# Patient Record
Sex: Male | Born: 1965 | Race: Black or African American | Hispanic: No | Marital: Single
Health system: Southern US, Community
[De-identification: ages and names within clinical notes are randomized; demographics above are authoritative.]

## PROBLEM LIST (undated history)

## (undated) ENCOUNTER — Ambulatory Visit

## (undated) ENCOUNTER — Telehealth

## (undated) ENCOUNTER — Encounter

## (undated) ENCOUNTER — Ambulatory Visit: Payer: MEDICARE

## (undated) ENCOUNTER — Ambulatory Visit: Attending: Physician Assistant | Primary: Physician Assistant

## (undated) ENCOUNTER — Encounter: Attending: Medical | Primary: Medical

## (undated) ENCOUNTER — Ambulatory Visit: Payer: MEDICARE | Attending: Gastroenterology | Primary: Gastroenterology

## (undated) DIAGNOSIS — I10 Essential (primary) hypertension: Secondary | ICD-10-CM

---

## 2004-01-30 ENCOUNTER — Emergency Department (HOSPITAL_COMMUNITY): Admission: EM | Admit: 2004-01-30 | Discharge: 2004-01-31 | Payer: Self-pay | Admitting: *Deleted

## 2004-05-30 ENCOUNTER — Emergency Department (HOSPITAL_COMMUNITY): Admission: EM | Admit: 2004-05-30 | Discharge: 2004-05-31 | Payer: Self-pay | Admitting: Emergency Medicine

## 2007-04-17 ENCOUNTER — Emergency Department: Payer: Self-pay | Admitting: Emergency Medicine

## 2007-11-23 ENCOUNTER — Emergency Department (HOSPITAL_COMMUNITY): Admission: EM | Admit: 2007-11-23 | Discharge: 2007-11-23 | Payer: Self-pay | Admitting: Emergency Medicine

## 2010-11-12 LAB — COMPREHENSIVE METABOLIC PANEL
ALT: 27
Creatinine, Ser: 1.12
GFR calc non Af Amer: >60
Total Protein: 7.2

## 2010-11-12 LAB — CBC
MCV: 99.2
Platelets: 168
RBC: 4.94
RDW: 13.7
WBC: 9.7

## 2010-11-12 LAB — URINALYSIS, ROUTINE W REFLEX MICROSCOPIC
Bilirubin Urine: NEGATIVE
Hgb urine dipstick: NEGATIVE
Ketones, ur: NEGATIVE
Nitrite: NEGATIVE
Protein, ur: NEGATIVE
Urobilinogen, UA: 1

## 2010-11-12 LAB — DIFFERENTIAL
Basophils Absolute: 0.1
Eosinophils Absolute: 0.1
Eosinophils Relative: 1
Lymphocytes Relative: 39
Lymphs Abs: 3.8
Neutrophils Relative %: 55

## 2010-11-12 LAB — POCT CARDIAC MARKERS
CKMB, poc: 1.4
Myoglobin, poc: 114
Troponin i, poc: <0.05

## 2017-04-21 ENCOUNTER — Ambulatory Visit: Admit: 2017-04-21 | Discharge: 2017-04-22 | Payer: MEDICARE

## 2017-04-21 DIAGNOSIS — R42 Dizziness and giddiness: Principal | ICD-10-CM

## 2017-04-21 DIAGNOSIS — R2689 Other abnormalities of gait and mobility: Secondary | ICD-10-CM

## 2017-04-21 DIAGNOSIS — H9313 Tinnitus, bilateral: Secondary | ICD-10-CM

## 2017-08-24 ENCOUNTER — Encounter (HOSPITAL_COMMUNITY): Payer: Self-pay

## 2017-08-24 ENCOUNTER — Emergency Department (HOSPITAL_COMMUNITY)
Admission: EM | Admit: 2017-08-24 | Discharge: 2017-08-24 | Disposition: A | Discharge status: Home / Self Care | Payer: Medicare Other | Attending: Emergency Medicine | Admitting: Emergency Medicine

## 2017-08-24 ENCOUNTER — Emergency Department (HOSPITAL_COMMUNITY): Payer: Medicare Other

## 2017-08-24 ENCOUNTER — Other Ambulatory Visit: Payer: Self-pay

## 2017-08-24 DIAGNOSIS — I10 Essential (primary) hypertension: Secondary | ICD-10-CM | POA: Diagnosis not present

## 2017-08-24 DIAGNOSIS — R001 Bradycardia, unspecified: Secondary | ICD-10-CM | POA: Insufficient documentation

## 2017-08-24 DIAGNOSIS — N433 Hydrocele, unspecified: Secondary | ICD-10-CM | POA: Diagnosis not present

## 2017-08-24 DIAGNOSIS — N50811 Right testicular pain: Secondary | ICD-10-CM | POA: Diagnosis present

## 2017-08-24 HISTORY — DX: Essential (primary) hypertension: I10

## 2017-08-24 LAB — URINALYSIS, ROUTINE W REFLEX MICROSCOPIC
BILIRUBIN URINE: NEGATIVE
Glucose, UA: NEGATIVE mg/dL
Hgb urine dipstick: NEGATIVE
Ketones, ur: NEGATIVE mg/dL
LEUKOCYTES UA: NEGATIVE
NITRITE: NEGATIVE
Protein, ur: NEGATIVE mg/dL
SPECIFIC GRAVITY, URINE: 1.001 — AB (ref 1.005–1.030)
pH: 8 (ref 5.0–8.0)

## 2017-08-24 LAB — COMPREHENSIVE METABOLIC PANEL
ALK PHOS: 69 U/L (ref 38–126)
ALT: 21 U/L (ref 0–44)
ANION GAP: 6 (ref 5–15)
AST: 28 U/L (ref 15–41)
Albumin: 3.4 g/dL — ABNORMAL LOW (ref 3.5–5.0)
BUN: 5 mg/dL — ABNORMAL LOW (ref 6–20)
CALCIUM: 8.9 mg/dL (ref 8.9–10.3)
CHLORIDE: 107 mmol/L (ref 98–111)
CO2: 27 mmol/L (ref 22–32)
Creatinine, Ser: 1.14 mg/dL (ref 0.61–1.24)
GFR calc non Af Amer: >60 mL/min (ref >60)
Glucose, Bld: 88 mg/dL (ref 70–99)
POTASSIUM: 3.5 mmol/L (ref 3.5–5.1)
SODIUM: 140 mmol/L (ref 135–145)
Total Bilirubin: 0.9 mg/dL (ref 0.3–1.2)
Total Protein: 6.5 g/dL (ref 6.5–8.1)

## 2017-08-24 LAB — CBC WITH DIFFERENTIAL/PLATELET
Abs Immature Granulocytes: 0 10*3/uL (ref 0.0–0.1)
Basophils Absolute: 0.1 10*3/uL (ref 0.0–0.1)
Basophils Relative: 1 %
EOS ABS: 0.1 10*3/uL (ref 0.0–0.7)
Eosinophils Relative: 1 %
HEMATOCRIT: 44.7 % (ref 39.0–52.0)
Hemoglobin: 14.6 g/dL (ref 13.0–17.0)
IMMATURE GRANULOCYTES: 0 %
LYMPHS ABS: 3.1 10*3/uL (ref 0.7–4.0)
Lymphocytes Relative: 36 %
MCH: 32.2 pg (ref 26.0–34.0)
MCHC: 32.7 g/dL (ref 30.0–36.0)
MCV: 98.5 fL (ref 78.0–100.0)
Monocytes Absolute: 0.6 10*3/uL (ref 0.1–1.0)
Monocytes Relative: 7 %
NEUTROS ABS: 4.8 10*3/uL (ref 1.7–7.7)
NEUTROS PCT: 55 %
PLATELETS: 154 10*3/uL (ref 150–400)
RBC: 4.54 MIL/uL (ref 4.22–5.81)
RDW: 12.9 % (ref 11.5–15.5)
WBC: 8.7 10*3/uL (ref 4.0–10.5)

## 2017-08-24 NOTE — Discharge Instructions (Addendum)
Thank you for allowing me to care for you today in the Emergency Department.   Please call tomorrow morning to schedule follow-up appointment with urology.  Your blood pressure was high this day despite taking her home blood pressure medication.  Please call and schedule a follow-up appointment with your primary care provider to have your blood pressure rechecked.  You should return to the emergency department if your blood pressure is high and you were having chest pain, severe headache, if you become unable to pee, or severe abdominal pain.  Take 600 mg of ibuprofen with food or 650 mg of Tylenol every 6 hours for pain.  Return to the emergency department if you develop a high fever, chills, if you become unable to pee, if the area around the penis or the scrotum becomes red, hot, or swollen to the touch, or if you develop other new, concerning symptoms.

## 2017-08-24 NOTE — ED Triage Notes (Signed)
Patient complains of groin pain x 2 days, denies testicle swelling, denies dysuria. Reports that he thinks his hernia is acting uo, NAD

## 2017-08-24 NOTE — ED Notes (Signed)
ED Provider at bedside. 

## 2017-08-24 NOTE — ED Provider Notes (Signed)
[facility] EMERGENCY DEPARTMENT Provider Note   CSN: [id number] Arrival date & time: 08/24/17  1057     History   Chief Complaint No chief complaint on file.   HPI Jeffrey SersDwight Seman is a 52 y.o. male with a history of hypertension who presents to the emergency department with a chief complaint of right testicle and groin pain.  The patient endorses recurrent episodes of pain to the right groin and right testicle, which have been going on for some time, but have gradually worsened over time..  Current episode of pain began 2 days ago.  He is unable to characterize the pain.  Pain is worse with walking and with sexual intercourse.  He states that he had intercourse earlier this week for the first time and to stop due to the pain.  He denies dysuria, penile or testicular swelling, penile discharge, fever, chills, rectal pain, abdominal pain, nausea, vomiting, or diarrhea.  He reports a history of a hernia repair on the right several years ago.  No treatment prior to arrival.  He reports that he has followed by his PCP for his hypertension.  He was compliant with his home antihypertensives this morning.  He denies chest pain, headache, dyspnea, visual changes, dizziness, lightheadedness at this time.  He does report some discomfort in his right shoulder that radiates his numbness and tingling intermittently down to his right hand.  He states the pain is intermittent and is only present if he sleeps on the right shoulder and with certain positional changes.  He is declining any pain or numbness at this time.  He reports that he is a former [employer] and remains active.  The history is provided by the patient. No language interpreter was used.    Past Medical History:  Diagnosis Date  . Hypertension     There are no active problems to display for this patient.   History reviewed. No pertinent surgical history.      Home Medications    Prior to Admission  medications   Not on File    Family History No family history on file.  Social History Social History   Tobacco Use  . Smoking status: Not on file  Substance Use Topics  . Alcohol use: Not on file  . Drug use: Not on file     Allergies   Patient has no known allergies.   Review of Systems Review of Systems  Constitutional: Negative for appetite change, chills and fever.  Eyes: Negative for visual disturbance.  Respiratory: Negative for shortness of breath.   Cardiovascular: Negative for chest pain.  Gastrointestinal: Negative for abdominal pain, constipation, diarrhea, nausea and vomiting.  Genitourinary: Positive for testicular pain. Negative for decreased urine volume, difficulty urinating, discharge, dysuria, flank pain, frequency, penile pain, penile swelling and scrotal swelling.  Musculoskeletal: Negative for back pain.  Skin: Negative for rash.  Allergic/Immunologic: Negative for immunocompromised state.  Neurological: Negative for dizziness, syncope, weakness, light-headedness and headaches.  Psychiatric/Behavioral: Negative for confusion.   Physical Exam Updated Vital Signs BP (!) 207/100   Pulse (!) 39   Temp 98.2 F (36.8 C)   Resp 20   SpO2 100%   Physical Exam  Constitutional: He appears well-developed.  HENT:  Head: Normocephalic.  Eyes: Conjunctivae are normal.  Neck: Neck supple.  Cardiovascular: Normal rate, regular rhythm, normal heart sounds and intact distal pulses. Exam reveals no gallop and no friction rub.  No murmur heard. Pulmonary/Chest: Effort normal. No stridor. No  respiratory distress. He has no wheezes. He has no rales. He exhibits no tenderness.  Abdominal: Soft. Bowel sounds are normal. He exhibits no distension and no mass. There is no tenderness. There is no rebound and no guarding. No hernia. Hernia confirmed negative in the right inguinal area and confirmed negative in the left inguinal area.  Abdomen is soft, nontender,  nondistended.  Genitourinary: Penis normal. Right testis shows tenderness. Right testis shows no mass and no swelling. Right testis is descended. Left testis shows no mass, no swelling and no tenderness. Left testis is descended. No phimosis, paraphimosis, hypospadias, penile erythema or penile tenderness.  Genitourinary Comments: Chaperoned exam. TTP  to the right inguinal area.  Negative for inguinal hernias bilaterally.  Musculoskeletal: He exhibits no tenderness.  No tenderness to the thoracic or lumbar spinous processes or paraspinal muscles bilaterally.  Lymphadenopathy: No inguinal adenopathy noted on the right or left side.  Neurological: He is alert.  Skin: Skin is warm and dry.  Psychiatric: His behavior is normal.  Nursing note and vitals reviewed.  ED Treatments / Results  Labs (all labs ordered are listed, but only abnormal results are displayed) Labs Reviewed  URINALYSIS, ROUTINE W REFLEX MICROSCOPIC - Abnormal; Notable for the following components:      Result Value   Color, Urine STRAW (*)    Specific Gravity, Urine 1.001 (*)    All other components within normal limits  COMPREHENSIVE METABOLIC PANEL - Abnormal; Notable for the following components:   BUN 5 (*)    Albumin 3.4 (*)    All other components within normal limits  CBC WITH DIFFERENTIAL/PLATELET    EKG None  Radiology US Scrotum W/doppler  Result Date: 08/24/2017 CLINICAL DATA:  Right groin and testicular pain for the past 2 days. EXAM: SCROTAL ULTRASOUND DOPPLER ULTRASOUND OF THE TESTICLES TECHNIQUE: Complete ultrasound examination of the testicles, epididymis, and other scrotal structures was performed. Color and spectral Doppler ultrasound were also utilized to evaluate blood flow to the testicles. COMPARISON:  None. FINDINGS: Right testicle Measurements: 3.8 x 1.9 x 3.0 cm. No mass or microlithiasis visualized. Left testicle Measurements: 3.4 x 2.8 x 2.4 cm. No mass or microlithiasis visualized. Right  epididymis:  Normal in size and appearance. Left epididymis:  Normal in size and appearance. Hydrocele:  Small bilateral hydroceles. Varicocele:  None visualized. Pulsed Doppler interrogation of both testes demonstrates normal low resistance arterial and venous waveforms bilaterally. IMPRESSION: 1. No acute abnormality. 2. Small bilateral hydroceles. Electronically Signed   By: Obie Dredge M.D.   On: 08/24/2017 14:35    Procedures Procedures (including critical care time)  Medications Ordered in ED Medications - No data to display   Initial Impression / Assessment and Plan / ED Course  I have reviewed the triage vital signs and the nursing notes.  Pertinent labs & imaging results that were available during my care of the patient were reviewed by me and considered in my medical decision making (see chart for details).     52 year old male with a history of hypertension presenting with right testicular and groin pain.  He reports recurrent episodes of pain for some time, but the most recent episode has been present for 2 days.  On exam, the patient is tender to palpation to the right testicle and right inguinal region.  No inguinal hernias bilaterally.  No testicular erythema or edema.  Labs are reassuring.  Scrotal ultrasound with bilateral hydroceles.  Otherwise negative.  EKG ordered by nursing staff with sinus  bradycardia.  Review of the patient's medical chart reveals that he has previously been seen by cardiology for sinus bradycardia in the past.  He is asymptomatic at this time.  Blood pressure has been fluctuating systolically between 160 and 200s as the patient's arrival.  He has taken his home blood pressure medications.  He is asymptomatic in regards to his hypertension and I doubt hypertensive urgency or emergency at this time.  Doubt Fournier's, varicocele, testicular neoplasm, or testicular torsion.  The patient was discussed with Dr. Juleen China, attending physician.  Recommended NSAIDs  for pain control and will give the patient a referral to urology.  I have also recommended that he follow-up with his PCP this week for a recheck of his blood pressure.  Strict return precautions to the emergency department given, including signs of hypertensive urgency or emergency, the patient acknowledges this and all questions have been answered.  He is in no acute distress and is safe for discharge home at this time.  Final Clinical Impressions(s) / ED Diagnoses   Final diagnoses:  Bilateral hydrocele  Bradycardia    ED Discharge Orders    None       Jeffrey Boards, PA-C 08/24/17 2146    Raeford Razor, MD 08/25/17 1526

## 2017-08-24 NOTE — ED Notes (Signed)
Patient Alert and oriented to baseline. Stable and ambulatory to baseline. Patient verbalized understanding of the discharge instructions.  Patient belongings were taken by the patient.   

## 2017-08-24 NOTE — ED Notes (Signed)
Unable to find pt in waiting room. Called multiple times with no answer.

## 2019-12-14 IMAGING — US US SCROTUM W/ DOPPLER COMPLETE
1 series · 14 of 25 positions shown · non-contrast
Comparison: None.

CLINICAL DATA: Right groin and testicular pain for the past 2 days.

EXAM:
SCROTAL ULTRASOUND
DOPPLER ULTRASOUND OF THE TESTICLES
TECHNIQUE: Complete ultrasound examination of the testicles, epididymis, and
other scrotal structures was performed. Color and spectral Doppler
ultrasound were also utilized to evaluate blood flow to the
testicles.

[Series 1: us scrotum w/ doppler complete · 0.07mm/px · 14 of 75 slices shown]
[im 1/75]
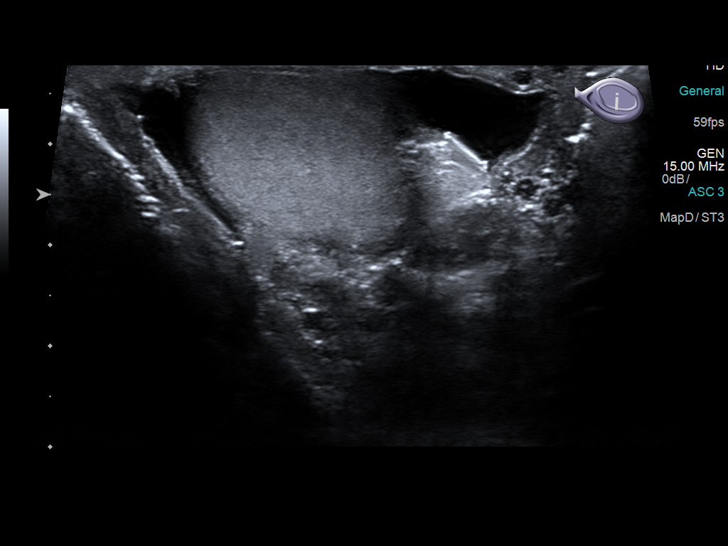
[im 7/75]
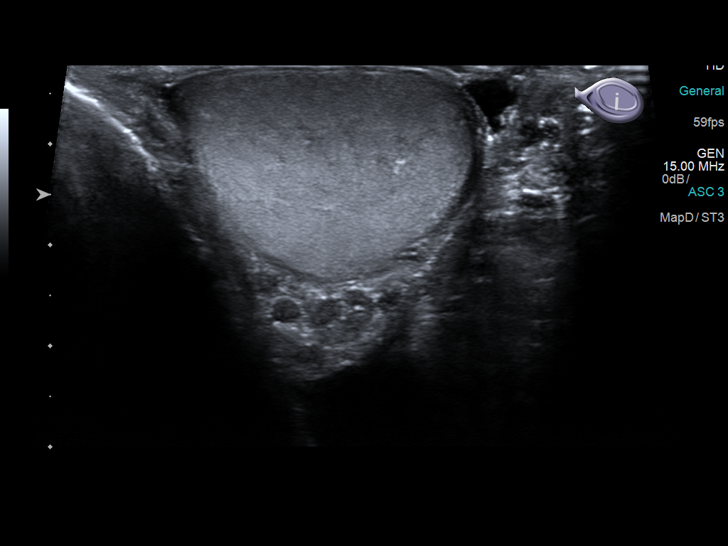
[im 13/75]
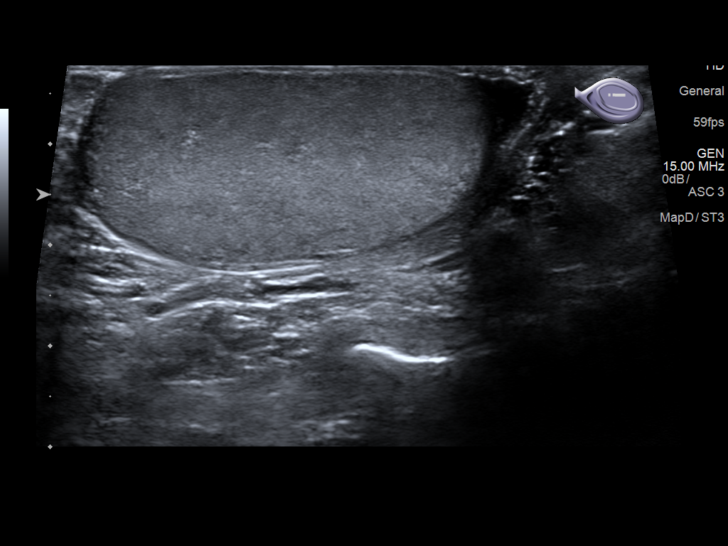
[im 19/75]
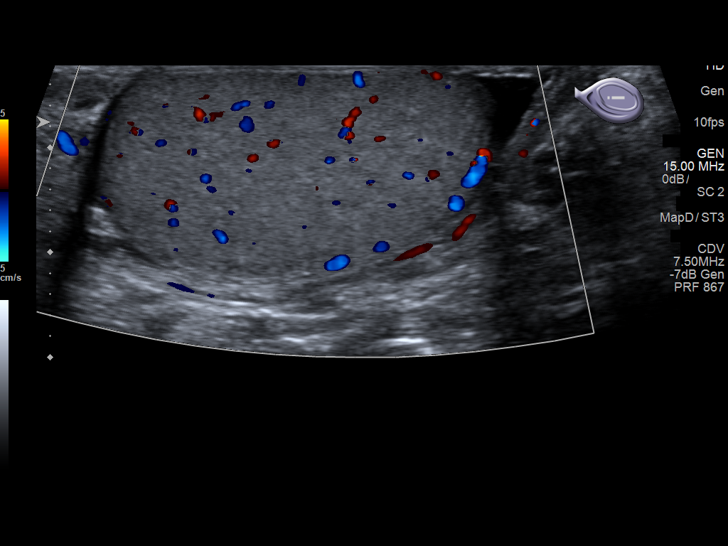
[im 25/75]
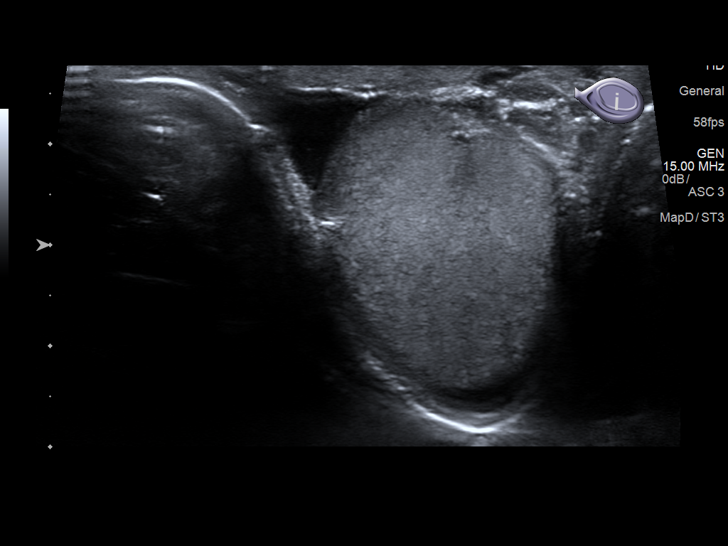
[im 28/75]
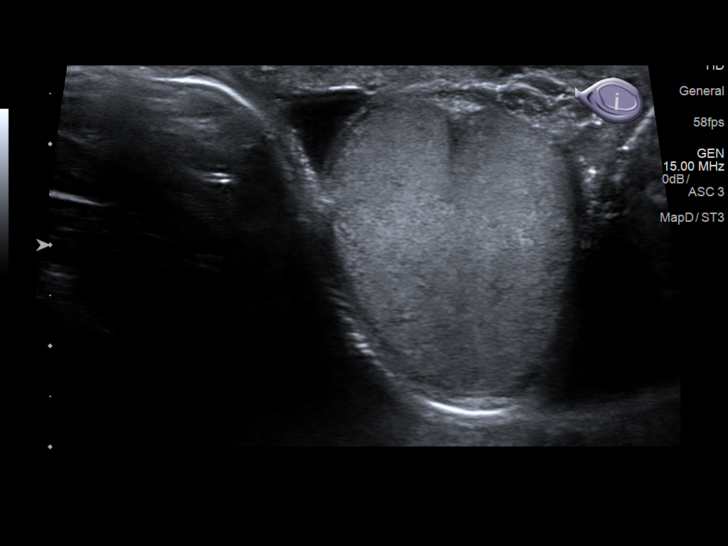
[im 34/75]
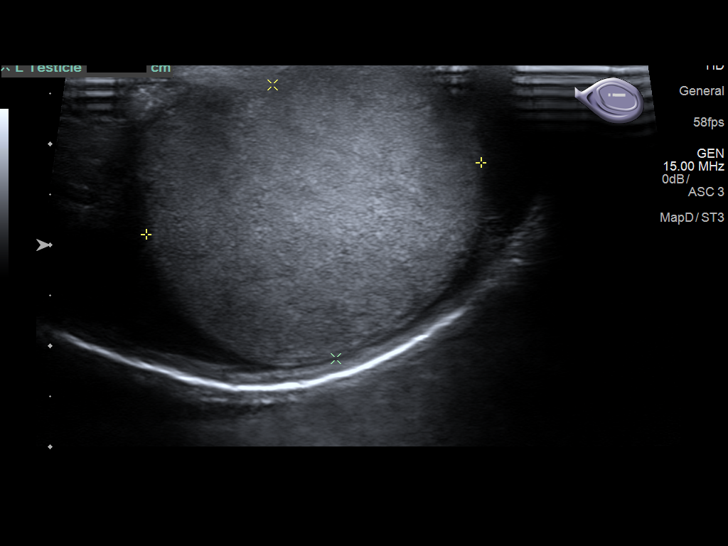
[im 41/75]
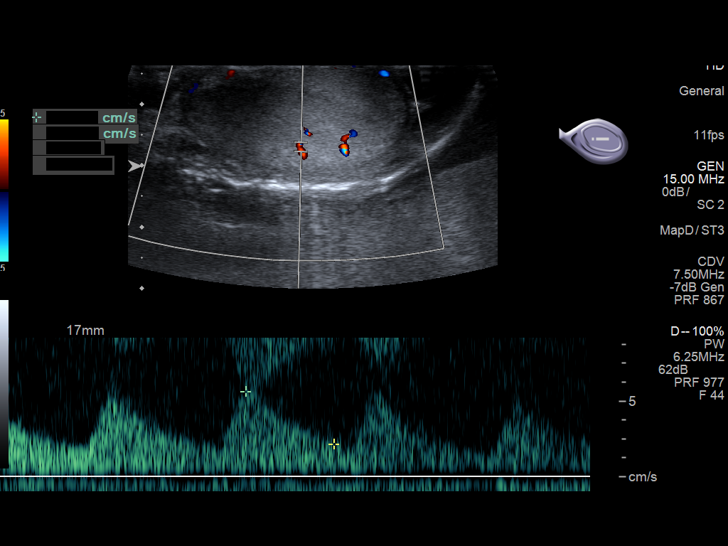
[im 47/75]
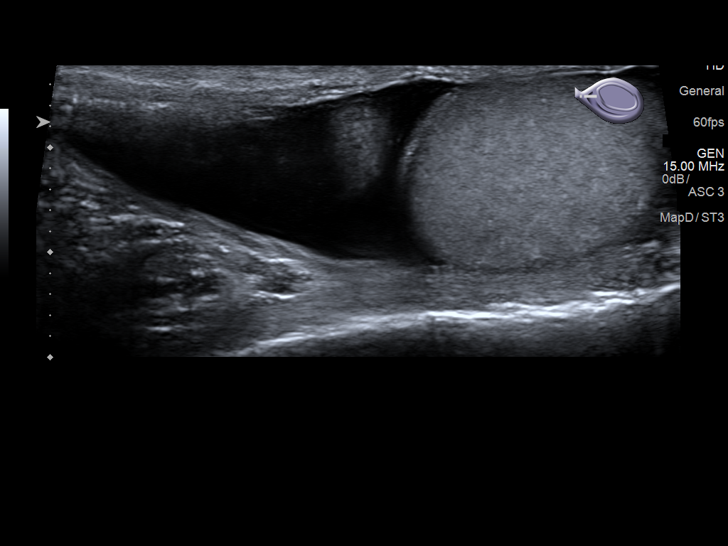
[im 50/75]
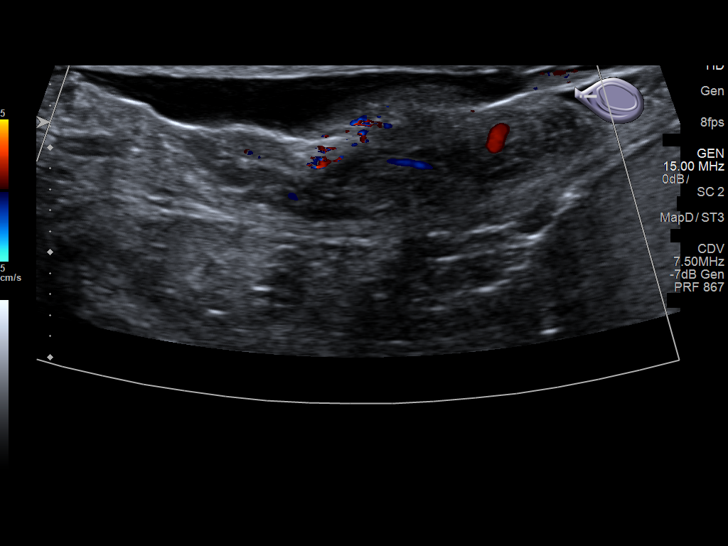
[im 56/75]
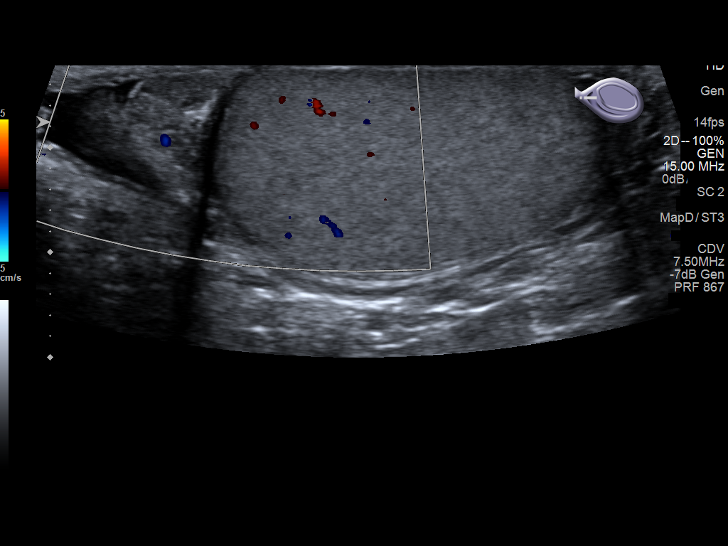
[im 62/75]
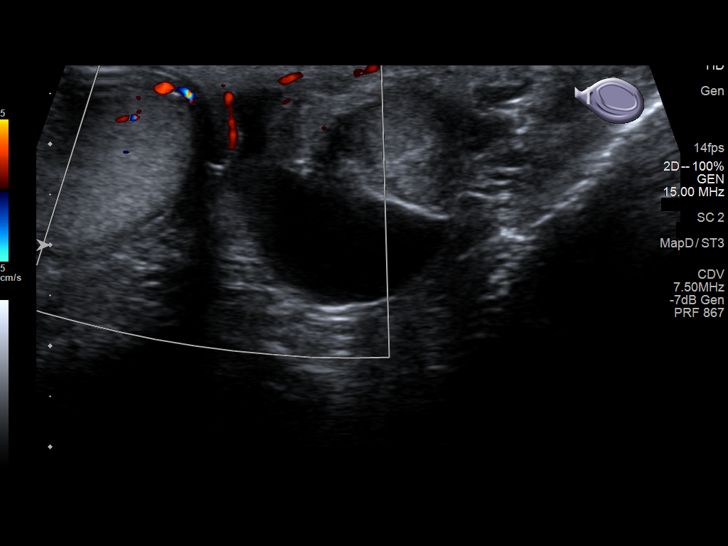
[im 68/75]
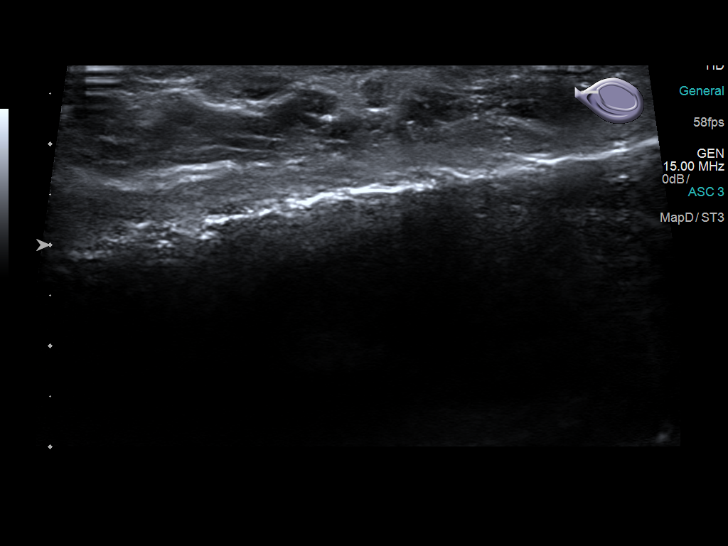
[im 75/75]
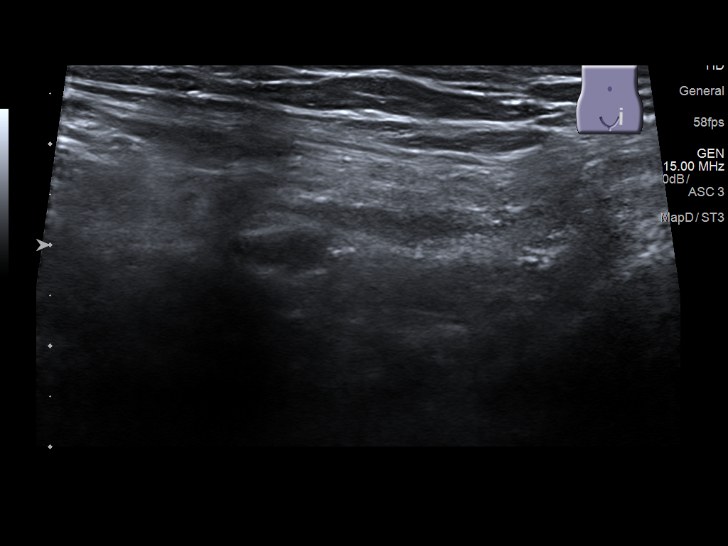

[14 of 25 positions shown; findings below may reference images not displayed]

FINDINGS: Right testicle

Measurements: 3.8 x 1.9 x 3.0 cm. No mass or microlithiasis
visualized.

Left testicle

Measurements: 3.4 x 2.8 x 2.4 cm. No mass or microlithiasis
visualized.

Right epididymis:  Normal in size and appearance.

Left epididymis:  Normal in size and appearance.

Hydrocele:  Small bilateral hydroceles.

Varicocele:  None visualized.

Pulsed Doppler interrogation of both testes demonstrates normal low
resistance arterial and venous waveforms bilaterally.
IMPRESSION: 1. No acute abnormality.
2. Small bilateral hydroceles.

## 2021-02-15 ENCOUNTER — Ambulatory Visit: Admit: 2021-02-15 | Discharge: 2021-02-16 | Disposition: A | Discharge status: Home / Self Care | Payer: MEDICARE

## 2021-02-15 ENCOUNTER — Emergency Department: Admit: 2021-02-15 | Discharge: 2021-02-16 | Disposition: A | Discharge status: Home / Self Care | Payer: MEDICARE

## 2021-02-15 DIAGNOSIS — I10 Essential (primary) hypertension: Principal | ICD-10-CM

## 2021-02-15 DIAGNOSIS — R9431 Abnormal electrocardiogram [ECG] [EKG]: Principal | ICD-10-CM

## 2021-03-01 DIAGNOSIS — B1920 Unspecified viral hepatitis C without hepatic coma: Principal | ICD-10-CM

## 2021-11-15 ENCOUNTER — Ambulatory Visit: Admit: 2021-11-15 | Discharge: 2021-11-16 | Payer: MEDICARE

## 2021-11-15 ENCOUNTER — Ambulatory Visit: Admit: 2021-11-15 | Discharge: 2021-11-16 | Payer: MEDICARE | Attending: Gastroenterology | Primary: Gastroenterology

## 2021-11-15 DIAGNOSIS — Z1159 Encounter for screening for other viral diseases: Principal | ICD-10-CM

## 2021-11-15 DIAGNOSIS — Z7289 Other problems related to lifestyle: Principal | ICD-10-CM

## 2021-11-15 DIAGNOSIS — N182 Chronic kidney disease, stage 2 (mild): Principal | ICD-10-CM

## 2021-11-15 DIAGNOSIS — B182 Chronic viral hepatitis C: Principal | ICD-10-CM

## 2021-11-21 DIAGNOSIS — B182 Chronic viral hepatitis C: Principal | ICD-10-CM

## 2021-11-21 MED ORDER — SOFOSBUVIR 400 MG-VELPATASVIR 100 MG TABLET
ORAL_TABLET | Freq: Every day | ORAL | 2 refills | 28 days | Status: CP
Start: 2021-11-21 — End: ?
  Filled 2021-12-03: qty 28, 28d supply, fill #0

## 2021-11-26 NOTE — Unmapped (Signed)
[facility] Specialty Medication Onboarding    Specialty Medication: EPCLUSA 400-100 mg tablet (sofosbuvir-velpatasvir)  Prior Authorization: Approved   Financial Assistance: No - copay  <$25  Final Copay/Day Supply: $0 / 28    Insurance Restrictions: Plan requests Brand    Notes to Pharmacist:     The triage team has completed the benefits investigation and has determined that the patient is able to fill this medication at [facility]. Please contact the patient to complete the onboarding or follow up with the prescribing physician as needed.

## 2021-11-28 NOTE — Unmapped (Signed)
 [facility]   Patient Onboarding/Medication Counseling    Shipment #1/3 for 12 week course - targeted completion date around 02/26/2022 (pending start date)    Zachary Santiago is a 56 y.o. male with Chronic Hepatitis C who I am counseling today on initiation of therapy.  I am speaking to the patient.    Was a Nurse, learning disability used for this call? No    Verified patient's date of birth / HIPAA.    Specialty medication(s) to be sent: Infectious Disease: Epclusa      Non-specialty medications/supplies to be sent: n/a      Medications not needed at this time: n/a         Epclusa (sofosbuvir and velpatasvir) 400mg /100mg     Planned Start Date: ~12/05/2021 (after touching base with the office)  Remind him that once he gets the medication he needs to call us before starting it so that we can put in subsequent labs.    Also, still need his ultrasound to be completed      Medication & Administration     Dosage: Take one tablet by mouth daily for 12 weeks.    Administration:  Take with or without food.  Antacids: Separate the administration of Epclusa and antacids by at least 4 hours.  H2 Receptor Antagonist: Administer H2 receptor antagonist doses less than or comparable to famotidine 40 mg twice daily simultaneously or 12 hours apart from [place].   Proton Pump Inhibitor (PPI): Epclusa should be administered with food and taken 4 hours before omeprazole max dose of 20mg .      Adherence/Missed dose instructions:  Take missed dose as soon as you remember. If it is close to the time of your next dose, skip the missed dose and resume with your next scheduled dose.  Do not take extra doses or 2 doses at the same time.  Avoid missing doses as it can result in development of resistance.    Goals of Therapy     The goal of therapy is to cure the patient of Hepatitis C. Patients who have undetectable HCV RNA in the serum, as assessed by a sensitive polymerase chain reaction (PCR) assay, ?12 weeks after treatment completion are deemed to have achieved SVR (cure). (www.hcvguidelines.org)    Side Effects & Monitoring Parameters     Common Side Effects:   Headache  Fatigue     To help minimize some of the side effects: (http://www.velazquez.com/)  Stay hydrated by drinking plenty of water and limiting caffeinated beverages such as coffee, tea and soda.  Do your hardest chores when you have the most energy.  Take short naps of 20 minutes or less that are and not too close to bedtime.    The following side effects should be reported to the provider:  Signs of liver problems like dark urine, feeling excessively tired, lack of hunger, upset stomach or stomach pain, light-colored stools, throwing up, or yellow skin/eyes.  Signs of an allergic reaction, such as rash; hives; itching; red, swollen, blistered, or peeling skin with or without fever. If you have wheezing; tightness in the chest or throat; trouble breathing, swallowing, or talking; unusual hoarseness; or swelling of the mouth, face, lips, tongue, or throat, call 911 or go to the closest emergency department (ED).     Monitoring Parameters: (AASLD-IDSA Guidelines)    Within 6 months prior to starting treatment:  Complete blood count (CBC). (Completed: 11/15/2021)  International normalized ratio (INR) if clinically indicated. (Completed: 11/15/2021)  Hepatic function  panel: albumin, total and direct bilirubin, ALT, AST, and Alkaline Phosphatase levels.(Completed: 11/15/2021)  Calculated glomerular filtration rate (eGFR).  Pregnancy test within 1 month of starting treatment for females of childbearing age (N/A)  Any time prior to starting treatment:  Test for HCV genotype. (1a)  Assess for hepatic fibrosis.  Quantitative HCV RNA (HCV viral load).284,132 High    Assess for active HBV coinfection: HBV surface antigen (HBsAg); If HBsAg positive, then should be assessed for whether HBV DNA level meets AASLD criteria for HBV treatment.(Completed: 11/15/2021)  Assess for evidence of prior HBV infection and immunity: HBV core antibody (anti-HBc) and HBV surface antibody (anti-HBS). (Completed: 11/15/2021)  Assess for HIV coinfection.(Completed: 11/15/2021)  Test for the presence of resistance-associated substitutions (RASs) prior to starting treatment if clinically indicated.    Monitoring during treatment:   Diabetics should monitor for signs of hypoglycemia.  Patients on warfarin should monitor for changes in INR.  Pregnancy test monthly in females of childbearing age  For HBsAg positive patients not already on HBV therapy because baseline HBV DNA level does not meet treatment criteria:   Initiate prophylactic HBV antiviral therapy OR  Monitor HBV DNA levels monthly during and immediately after Epclusa therapy.    After treatment to document a cure:   Quantitative HCV RNA12 weeks after completion of therapy.    Contraindications, Warnings, & Precautions     Hepatitis B reactivation.  Bradycardia with amiodarone coadministration: Coadministration not recommended. If unavoidable, patients should have inpatient cardiac monitoring for 48 hours after first Epclusa dose, followed by daily outpatient or self-monitoring of heart rate for at least the first 2 weeks of treatment. Due to the long half-life of amiodarone, cardiac monitoring is also recommended if amiodarone was discontinued just prior to beginning treatment.     Drug/Food Interactions     Medication list reviewed in Epic. The patient was instructed to inform the care team before taking any new medications or supplements. No drug interactions identified.  Encourage minimizing alcohol intake.  Antacids: Separate the administration of Epclusa and antacids by at least 4 hours.  H2 Receptor Antagonists: Administer H2 receptor antagonist doses less than or comparable to famotidine 40 mg twice daily simultaneously or 12 hours apart from [place].  Proton pump inhibitors: Not recommended. If medically necessary, Epclusa should be administered with food and taken 4 hours before omeprazole at a maximum dose of 20mg . Other proton pump inhibitors have not been studied.  HMG-CoA reductase inhibitors: monitor for signs of statin toxicity such as myopathy including rhabdomyolysis. Dose reduction of statin may be necessary. Ask patients to self-report potential side effects of increased concentrations such as muscle pain.  Rosuvastatin dose should not exceed 10mg .  Atorvastatin : Consider dose reduction if patient's dose is greater than 40mg .  Pravastatin: no clinically significant interaction.  Potential interactions: Clearance of HCV infection with direct acting antivirals may lead to changes in hepatic function, which may impact the safe and effective use of concomitant medications. Frequent monitoring of relevant laboratory parameters (e.g., International Normalized Ratio [INR] in patients taking warfarin, blood glucose levels in diabetic patients) or drug concentrations of concomitant medications such as cytochrome P450 substrates with a narrow therapeutic index (e.g. certain immunosuppressants) is recommended to ensure safe and effective use. Dose adjustments of concomitant medications may be necessary.    Storage, Handling Precautions, & Disposal     Store this medication in the original container at room temperature.  Store in a dry place. Do not store in a bathroom.  Keep all drugs in a safe place. Keep all drugs out of the reach of children and pets.  Disposal: You should NOT have any Epclusa left over to throw away     Current Medications (including OTC/herbals), Comorbidities and Allergies     Current Outpatient Medications   Medication Sig Dispense Refill    hydrALAZINE (APRESOLINE) 100 MG tablet Take 1 tablet (100 mg total) by mouth in the morning.      lisinopriL-hydroCHLOROthiazide (PRINZIDE,ZESTORETIC) 20-25 mg per tablet Take 1 tablet by mouth daily.      sofosbuvir-velpatasvir (EPCLUSA) 400-100 mg tablet Take 1 tablet by mouth daily. 28 tablet 2 No current facility-administered medications for this visit.       No Known Allergies    There is no problem list on file for this patient.      Reviewed and up to date in Epic.    Appropriateness of Therapy     Acute infections noted within Epic:  No active infections  Patient reported infection: None    Is medication and dose appropriate based on diagnosis and infection status? Yes    Prescription has been clinically reviewed: Yes      Baseline Quality of Life Assessment      How many days over the past month did your Chronic Hepatitis C  keep you from your normal activities? For example, brushing your teeth or getting up in the morning. 0    Financial Information     Medication Assistance provided: Prior Authorization    Anticipated copay of $0/28 day supply reviewed with patient. Verified delivery address.    Delivery Information     Scheduled delivery date: 12/04/2021    Expected start date: 12/05/2021    Medication will be delivered via UPS to the prescription address in [facility].  This shipment will not require a signature.      Explained the services we provide at [facility] and that each month we would call to set up refills.  Stressed importance of returning phone calls so that we could ensure they receive their medications in time each month.  Informed patient that we should be setting up refills 7-10 days prior to when they will run out of medication.  A pharmacist will reach out to perform a clinical assessment periodically.  Informed patient that a welcome packet, containing information about our pharmacy and other support services, a Notice of Privacy Practices, and a drug information handout will be sent.      The patient or caregiver noted above participated in the development of this care plan and knows that they can request review of or adjustments to the care plan at any time.      Patient or caregiver verbalized understanding of the above information as well as how to contact the pharmacy at [phone] option 4 with any questions/concerns.  The pharmacy is open Monday through Friday 8:30am-4:30pm.  A pharmacist is available 24/7 via pager to answer any clinical questions they may have.    Patient Specific Needs     Does the patient have any physical, cognitive, or cultural barriers? No    Does the patient have adequate living arrangements? (i.e. the ability to store and take their medication appropriately) Yes    Did you identify any home environmental safety or security hazards? No    Patient prefers to have medications discussed with  Patient     Is the patient or caregiver able to read and understand education  materials at a high school level or above? Yes    Patient's primary language is  English     Is the patient high risk? No    SOCIAL DETERMINANTS OF HEALTH     At the [facility], we have learned that life circumstances - like trouble affording food, housing, utilities, or transportation can affect the health of many of our patients.   That is why we wanted to ask: are you currently experiencing any life circumstances that are negatively impacting your health and/or quality of life? Patient declined to answer    Social Determinants of Health     Financial Resource Strain: Not on file   Internet Connectivity: Not on file   Food Insecurity: Not on file   Tobacco Use: High Risk (11/15/2021)    Patient History     Smoking Tobacco Use: Every Day     Smokeless Tobacco Use: Never     Passive Exposure: Not on file   Housing/Utilities: Not on file   Alcohol Use: Not At Risk (11/15/2021)    Alcohol Use     How often do you have a drink containing alcohol?: Never     How many drinks containing alcohol do you have on a typical day when you are drinking?: Not on file     How often do you have 5 or more drinks on one occasion?: Not on file   Transportation Needs: Not on file   Substance Use: Not on file   Health Literacy: Not on file   Physical Activity: Not on file Interpersonal Safety: Not on file   Stress: Not on file   Intimate Partner Violence: Not on file   Depression: Not on file   Social Connections: Not on file       Would you be willing to receive help with any of the needs that you have identified today? Not applicable       Teofilo Pod, PharmD  [facility] Specialty Pharmacist

## 2021-11-29 NOTE — Unmapped (Signed)
Noted  

## 2021-12-05 NOTE — Unmapped (Signed)
Triage vm.  Returned call    Patient has received Epclusa medication. He was instructed to call before taking medications.  Please advise

## 2021-12-05 NOTE — Unmapped (Signed)
Patient will start medication

## 2021-12-18 NOTE — Unmapped (Signed)
Not in network with [facility]  Received: Today  [place], Rolland Porter, CMA; Basil Dess, CMA; Allean Found D  Please be advised we are not in network with [employer]- please find patient another facility that will take his insurance.      Thank you    Patient notified. Offered [facility] in [place] but patient stated that is too far. I faxed order to [facility] [place].

## 2021-12-20 NOTE — Unmapped (Signed)
[facility] Clinical Assessment & Refill Coordination Note    Shipment #2/3 for Epclusa x 12 week course   Zachary Santiago reports occasional headaches since starting Epclusa. Discussed importance of drinking water and staying hydrated and avoid sugary juices (esp: orange juice as has tyramine which can trigger migraines in some individuals) and to avoid caffeinated beverages as much as possible.   Zachary Santiago reports starting the following medications: amlodipine 10mg  and stated he was taken off the combo (lisinopril-hydrochlorothiazide) and started on lisinopril only      Zachary Santiago, DOB: [birth year]  Phone: There are no phone numbers on file.    All above HIPAA information was verified with patient.     Was a Nurse, learning disability used for this call? No    Specialty Medication(s):   Infectious Disease: Epclusa     Current Outpatient Medications   Medication Sig Dispense Refill    amlodipine (NORVASC) 10 MG tablet Take 1 tablet (10 mg total) by mouth daily.      sofosbuvir-velpatasvir (EPCLUSA) 400-100 mg tablet Take 1 tablet by mouth daily. 28 tablet 2    hydrALAZINE (APRESOLINE) 100 MG tablet Take 1 tablet (100 mg total) by mouth in the morning.      lisinopriL (PRINIVIL,ZESTRIL) 20 MG tablet Take 1 tablet (20 mg total) by mouth daily.      lisinopriL-hydroCHLOROthiazide (PRINZIDE,ZESTORETIC) 20-25 mg per tablet Take 1 tablet by mouth daily.       No current facility-administered medications for this visit.        Changes to medications: Zachary Santiago reports starting the following medications: amlodipine 10mg  and stated he was taken off the combo (lisinopril-hydrochlorothiazide) and started on lisinopril only.    No Known Allergies    Changes to allergies: No    SPECIALTY MEDICATION ADHERENCE     Epclusa 400-100 mg: 15 days of medicine on hand     Medication Adherence    Patient reported X missed doses in the last month: 0  Specialty Medication: Epclusa 400-100mg  - 1QD  Patient is on additional specialty medications: No  Patient is on more than two specialty medications: No  Any gaps in refill history greater than 2 weeks in the last 3 months: no  Demonstrates understanding of importance of adherence: yes  Informant: patient                            Specialty medication(s) dose(s) confirmed: Regimen is correct and unchanged.     Are there any concerns with adherence? No    Adherence counseling provided? Not needed    CLINICAL MANAGEMENT AND INTERVENTION      Clinical Benefit Assessment:    Do you feel the medicine is effective or helping your condition? Yes    Clinical Benefit counseling provided? Not needed    Adverse Effects Assessment:    Are you experiencing any side effects? Yes, patient reports experiencing occasional headache. Side effect counseling provided: discussed importance of drinking water and staying hydrated and avoid sugary juices (esp: orange juice as has tyramine which can trigger migraines in some individuals).     Are you experiencing difficulty administering your medicine? No    Quality of Life Assessment:    Quality of Life    Rheumatology  Oncology  Dermatology  Cystic Fibrosis          How many days over the past month did your Hep C  keep you from your normal  activities? For example, brushing your teeth or getting up in the morning. 0    Have you discussed this with your provider? Not needed    Acute Infection Status:    Acute infections noted within Epic:  No active infections  Patient reported infection: None    Therapy Appropriateness:    Is therapy appropriate and patient progressing towards therapeutic goals? Yes, therapy is appropriate and should be continued    DISEASE/MEDICATION-SPECIFIC INFORMATION      For Hepatitis C patients (clinical assessment):  Regimen: 2 x 12 weeks  Therapy start date: 11/06/21  Completed Treatment Week #: 2  What time of day do you take your medicine? Morning  Pill count over the phone revealed #15 tablets which is appropriate.    Are you taking any new OTC or herbal medication? No  Any alcoholic beverages? No    Hepatitis C: Not Applicable    PATIENT SPECIFIC NEEDS     Does the patient have any physical, cognitive, or cultural barriers? No    Is the patient high risk? No    Did the patient require a clinical intervention? No    Does the patient require physician intervention or other additional services (i.e., nutrition, smoking cessation, social work)? No    SOCIAL DETERMINANTS OF HEALTH     At the [facility], we have learned that life circumstances - like trouble affording food, housing, utilities, or transportation can affect the health of many of our patients.   That is why we wanted to ask: are you currently experiencing any life circumstances that are negatively impacting your health and/or quality of life? No    Social Determinants of Psychologist, prison and probation services Strain: Not on file   Internet Connectivity: Not on file   Food Insecurity: Not on file   Tobacco Use: High Risk (11/15/2021)    Patient History     Smoking Tobacco Use: Every Day     Smokeless Tobacco Use: Never     Passive Exposure: Not on file   Housing/Utilities: Not on file   Alcohol Use: Not At Risk (11/15/2021)    Alcohol Use     How often do you have a drink containing alcohol?: Never     How many drinks containing alcohol do you have on a typical day when you are drinking?: Not on file     How often do you have 5 or more drinks on one occasion?: Not on file   Transportation Needs: Not on file   Substance Use: Not on file   Health Literacy: Not on file   Physical Activity: Not on file   Interpersonal Safety: Not on file   Stress: Not on file   Intimate Partner Violence: Not on file   Depression: Not on file   Social Connections: Not on file       Would you be willing to receive help with any of the needs that you have identified today? Not applicable       SHIPPING     Specialty Medication(s) to be Shipped:   Infectious Disease: Epclusa    Other medication(s) to be shipped: No additional medications requested for fill at this time     Changes to insurance: No    Delivery Scheduled: Yes, Expected medication delivery date: 12/26/2021.     Medication will be delivered via UPS to the confirmed prescription address in [facility].    The patient will receive a drug information handout for  each medication shipped and additional FDA Medication Guides as required.  Verified that patient has previously received a Conservation officer, historic buildings and a Surveyor, mining.    The patient or caregiver noted above participated in the development of this care plan and knows that they can request review of or adjustments to the care plan at any time.      All of the patient's questions and concerns have been addressed.    Teofilo Pod, PharmD   [facility] Specialty Pharmacist

## 2021-12-25 MED FILL — EPCLUSA 400 MG-100 MG TABLET: ORAL | 28 days supply | Qty: 28 | Fill #1

## 2022-01-21 NOTE — Unmapped (Signed)
Specialty Medication(s): [name]    Zachary Santiago has been dis-enrolled from the [facility] specialty pharmacy services due to therapy completion - expected therapy completion date: 02/26/22 .    Additional information provided to the patient: n/a    Teofilo Pod, PharmD  [facility] Specialty Pharmacist

## 2022-01-21 NOTE — Unmapped (Signed)
[facility] Clinical Assessment & Refill Coordination Note    Zachary Santiago, DOB: Jul 04, 1965  Phone: There are no phone numbers on file.    All above HIPAA information was verified with patient.     Was a Nurse, learning disability used for this call? No    Specialty Medication(s):   Infectious Disease: Epclusa     Current Outpatient Medications   Medication Sig Dispense Refill    amlodipine (NORVASC) 10 MG tablet Take 1 tablet (10 mg total) by mouth daily.      hydrALAZINE (APRESOLINE) 100 MG tablet Take 1 tablet (100 mg total) by mouth in the morning.      lisinopriL (PRINIVIL,ZESTRIL) 20 MG tablet Take 1 tablet (20 mg total) by mouth daily.      sofosbuvir-velpatasvir (EPCLUSA) 400-100 mg tablet Take 1 tablet by mouth daily. 28 tablet 2     No current facility-administered medications for this visit.        Changes to medications: Zachary Santiago reports no changes at this time.    No Known Allergies    Changes to allergies: No    SPECIALTY MEDICATION ADHERENCE     Epclusa 400-100 mg: ~7 days of medicine on hand     Medication Adherence    Patient reported X missed doses in the last month: 0  Specialty Medication: Eplusa 400-100mg : 1 QD  Patient is on additional specialty medications: No  Patient is on more than two specialty medications: No  Any gaps in refill history greater than 2 weeks in the last 3 months: no  Demonstrates understanding of importance of adherence: yes  Informant: patient                            Specialty medication(s) dose(s) confirmed: Regimen is correct and unchanged.     Are there any concerns with adherence? No    Adherence counseling provided? Not needed    CLINICAL MANAGEMENT AND INTERVENTION      Clinical Benefit Assessment:    Do you feel the medicine is effective or helping your condition? Yes    Clinical Benefit counseling provided? Not needed    Adverse Effects Assessment:    Are you experiencing any side effects? No    Are you experiencing difficulty administering your medicine? No    Quality of Life Assessment:    Quality of Life    Rheumatology  Oncology  Dermatology  Cystic Fibrosis          How many days over the past month did your Hep C  keep you from your normal activities? For example, brushing your teeth or getting up in the morning. 0    Have you discussed this with your provider? Not needed    Acute Infection Status:    Acute infections noted within Epic:  No active infections  Patient reported infection: None    Therapy Appropriateness:    Is therapy appropriate and patient progressing towards therapeutic goals? Yes, therapy is appropriate and should be continued    DISEASE/MEDICATION-SPECIFIC INFORMATION      For Hepatitis C patients (refill assessment): Has anything changed regarding the time you take your medications? No    Hepatitis C: Not Applicable    PATIENT SPECIFIC NEEDS     Does the patient have any physical, cognitive, or cultural barriers? No    Is the patient high risk? No    Did the patient require a clinical intervention?  No    Does the patient require physician intervention or other additional services (i.e., nutrition, smoking cessation, social work)? No    SOCIAL DETERMINANTS OF HEALTH     At the [facility], we have learned that life circumstances - like trouble affording food, housing, utilities, or transportation can affect the health of many of our patients.   That is why we wanted to ask: are you currently experiencing any life circumstances that are negatively impacting your health and/or quality of life? Patient declined to answer    Social Determinants of Health     Financial Resource Strain: Not on file   Internet Connectivity: Not on file   Food Insecurity: Not on file   Tobacco Use: High Risk (11/15/2021)    Patient History     Smoking Tobacco Use: Every Day     Smokeless Tobacco Use: Never     Passive Exposure: Not on file   Housing/Utilities: Not on file   Alcohol Use: Not At Risk (11/15/2021)    Alcohol Use     How often do you have a drink containing alcohol?: Never     How many drinks containing alcohol do you have on a typical day when you are drinking?: Not on file     How often do you have 5 or more drinks on one occasion?: Not on file   Transportation Needs: Not on file   Substance Use: Not on file   Health Literacy: Not on file   Physical Activity: Not on file   Interpersonal Safety: Not on file   Stress: Not on file   Intimate Partner Violence: Not on file   Depression: Not on file   Social Connections: Not on file       Would you be willing to receive help with any of the needs that you have identified today? Not applicable       SHIPPING     Specialty Medication(s) to be Shipped:   Infectious Disease: Epclusa    Other medication(s) to be shipped: No additional medications requested for fill at this time     Changes to insurance: No    Delivery Scheduled: Yes, Expected medication delivery date: 12/15.     Medication will be delivered via UPS to the confirmed prescription address in [facility].    The patient will receive a drug information handout for each medication shipped and additional FDA Medication Guides as required.  Verified that patient has previously received a Conservation officer, historic buildings and a Surveyor, mining.    The patient or caregiver noted above participated in the development of this care plan and knows that they can request review of or adjustments to the care plan at any time.      All of the patient's questions and concerns have been addressed.    Teofilo Pod, PharmD   [facility] Specialty Pharmacist

## 2022-01-24 MED FILL — EPCLUSA 400 MG-100 MG TABLET: ORAL | 28 days supply | Qty: 28 | Fill #2

## 2022-02-14 ENCOUNTER — Ambulatory Visit: Admit: 2022-02-14 | Discharge: 2022-02-14 | Payer: MEDICARE | Attending: Gastroenterology | Primary: Gastroenterology

## 2022-02-14 ENCOUNTER — Ambulatory Visit: Admit: 2022-02-14 | Discharge: 2022-02-14 | Payer: MEDICARE

## 2022-02-14 DIAGNOSIS — B182 Chronic viral hepatitis C: Principal | ICD-10-CM

## 2022-02-14 DIAGNOSIS — Z1159 Encounter for screening for other viral diseases: Principal | ICD-10-CM

## 2022-02-14 DIAGNOSIS — R160 Hepatomegaly, not elsewhere classified: Principal | ICD-10-CM

## 2022-02-18 DIAGNOSIS — B182 Chronic viral hepatitis C: Principal | ICD-10-CM

## 2022-02-18 MED ORDER — SOFOSBUVIR 400 MG-VELPATASVIR 100 MG TABLET
ORAL_TABLET | Freq: Every day | ORAL | 2 refills | 28 days | Status: CP
Start: 2022-02-18 — End: ?
  Filled 2022-02-25: qty 28, 28d supply, fill #0

## 2022-02-18 MED ORDER — PEG-ELECTROLYTE SOLUTION 420 GRAM ORAL SOLUTION
0 refills | 0 days | Status: CP
Start: 2022-02-18 — End: ?

## 2022-02-19 DIAGNOSIS — B182 Chronic viral hepatitis C: Principal | ICD-10-CM

## 2022-02-22 NOTE — Unmapped (Signed)
[facility] Clinical Assessment & Refill Coordination Note    Continuing on Epclusa for another 12 weeks (24 weeks in total) - new targeted completion date of ~ April 9th - JS    Zachary Santiago, DOB: 1965-07-18  Phone: There are no phone numbers on file.    All above HIPAA information was verified with patient.     Was a Nurse, learning disability used for this call? No    Specialty Medication(s):   Infectious Disease: Epclusa     Current Outpatient Medications   Medication Sig Dispense Refill    amlodipine (NORVASC) 10 MG tablet Take 1 tablet (10 mg total) by mouth daily.      aripiprazole (ABILIFY) 15 MG tablet Take 1 tablet (15 mg total) by mouth nightly.      hydrALAZINE (APRESOLINE) 100 MG tablet Take 1 tablet (100 mg total) by mouth in the morning.      lisinopril-hydroCHLOROthiazide (PRINZIDE,ZESTORETIC) 20-12.5 mg per tablet Take 1 tablet by mouth daily.      peg-electrolyte soln (NULYTELY) 420 gram SolR Use as directed by providers office 4000 mL 0    sofosbuvir-velpatasvir (EPCLUSA) 400-100 mg tablet Take 1 tablet by mouth daily. 28 tablet 2    spironolactone (ALDACTONE) 50 MG tablet Take 1 tablet (50 mg total) by mouth daily.       No current facility-administered medications for this visit.        Changes to medications: [name] reports no changes at this time.    No Known Allergies    Changes to allergies: No    SPECIALTY MEDICATION ADHERENCE     Epclusa 400-100 mg: ~5 days of medicine on hand     Medication Adherence    Patient reported X missed doses in the last month: 0  Specialty Medication: Epclusa 400-100mg  - 1QD  Patient is on additional specialty medications: No  Patient is on more than two specialty medications: No  Any gaps in refill history greater than 2 weeks in the last 3 months: no  Demonstrates understanding of importance of adherence: yes  Informant: patient                            Specialty medication(s) dose(s) confirmed: Regimen is correct and unchanged. Are there any concerns with adherence? No    Adherence counseling provided? Not needed    CLINICAL MANAGEMENT AND INTERVENTION      Clinical Benefit Assessment:    Do you feel the medicine is effective or helping your condition? Yes    Clinical Benefit counseling provided? Not needed    Adverse Effects Assessment:    Are you experiencing any side effects? No    Are you experiencing difficulty administering your medicine? No    Quality of Life Assessment:    Quality of Life    Rheumatology  Oncology  Dermatology  Cystic Fibrosis          How many days over the past month did your Hep C  keep you from your normal activities? For example, brushing your teeth or getting up in the morning. 0    Have you discussed this with your provider? Not needed    Acute Infection Status:    Acute infections noted within Epic:  No active infections  Patient reported infection: None    Therapy Appropriateness:    Is therapy appropriate and patient progressing towards therapeutic goals? Yes, therapy is appropriate and should be  continued    DISEASE/MEDICATION-SPECIFIC INFORMATION      For Hepatitis C patients (refill assessment): Has anything changed regarding the time you take your medications? No    Hepatitis C: Not Applicable    PATIENT SPECIFIC NEEDS     Does the patient have any physical, cognitive, or cultural barriers? No    Is the patient high risk? No    Did the patient require a clinical intervention? No    Does the patient require physician intervention or other additional services (i.e., nutrition, smoking cessation, social work)? No    SOCIAL DETERMINANTS OF HEALTH     At the [facility], we have learned that life circumstances - like trouble affording food, housing, utilities, or transportation can affect the health of many of our patients.   That is why we wanted to ask: are you currently experiencing any life circumstances that are negatively impacting your health and/or quality of life? No    Social Determinants of Psychologist, prison and probation services Strain: Not on file   Internet Connectivity: Not on file   Food Insecurity: Not on file   Tobacco Use: High Risk (02/14/2022)    Patient History     Smoking Tobacco Use: Every Day     Smokeless Tobacco Use: Never     Passive Exposure: Not on file   Housing/Utilities: Not on file   Alcohol Use: Not At Risk (11/15/2021)    Alcohol Use     How often do you have a drink containing alcohol?: Never     How many drinks containing alcohol do you have on a typical day when you are drinking?: Not on file     How often do you have 5 or more drinks on one occasion?: Not on file   Transportation Needs: Not on file   Substance Use: Not on file   Health Literacy: Not on file   Physical Activity: Not on file   Interpersonal Safety: Not on file   Stress: Not on file   Intimate Partner Violence: Not on file   Depression: Not on file   Social Connections: Not on file       Would you be willing to receive help with any of the needs that you have identified today? Not applicable       SHIPPING     Specialty Medication(s) to be Shipped:   Infectious Disease: Epclusa    Other medication(s) to be shipped: No additional medications requested for fill at this time     Changes to insurance: No    Delivery Scheduled: Yes, Expected medication delivery date: 1/17.     Medication will be delivered via UPS to the confirmed prescription address [facility].    The patient will receive a drug information handout for each medication shipped and additional FDA Medication Guides as required.  Verified that patient has previously received a Conservation officer, historic buildings and a Surveyor, mining.    The patient or caregiver noted above participated in the development of this care plan and knows that they can request review of or adjustments to the care plan at any time.      All of the patient's questions and concerns have been addressed.    Teofilo Pod, PharmD   [facility] Specialty Pharmacist

## 2022-02-22 NOTE — Unmapped (Signed)
[facility] Specialty Medication Onboarding    Specialty Medication: EPCLUSA 400-100 mg tablet (sofosbuvir-velpatasvir)  Prior Authorization: Approved   Financial Assistance: No - copay  <$25  Final Copay/Day Supply: $0 / 28    Insurance Restrictions: None     Notes to Pharmacist:     The triage team has completed the benefits investigation and has determined that the patient is able to fill this medication at [facility]. Please contact the patient to complete the onboarding or follow up with the prescribing physician as needed.

## 2022-02-26 NOTE — Unmapped (Signed)
RE: Zachary Santiago  Received: Today  Majithia, Franne Forts, MD  Sunday Shams, CMA  Awesome!  Can we schedule him a follow up end of feb please     Epclusa approved to extend therapy. Left message on patients voicemail to call back to move appointment sooner to end of February.

## 2022-02-27 NOTE — Unmapped (Signed)
Pt left a message returning call.      To Sharyl Nimrod can you reach out to reschedule?

## 2022-02-27 NOTE — Unmapped (Signed)
Rescheduled 4/4 appointment to 3/7 with RTM.

## 2022-04-01 NOTE — Unmapped (Signed)
[facility] Refill Coordination Note    Specialty Medication(s) to be Shipped:   Infectious Disease: Epclusa    Other medication(s) to be shipped: No additional medications requested for fill at this time     Zachary Santiago, DOB: 1965-03-13  Phone: There are no phone numbers on file.      All above HIPAA information was verified with patient.     Was a Nurse, learning disability used for this call? No    Completed refill call assessment today to schedule patient's medication shipment from the [facility] [phone]).  All relevant notes have been reviewed.     Specialty medication(s) and dose(s) confirmed: Regimen is correct and unchanged.   Changes to medications: Zachary Santiago reports no changes at this time.  Changes to insurance: No  New side effects reported not previously addressed with a pharmacist or physician: None reported  Questions for the pharmacist: No    Confirmed patient received a Conservation officer, historic buildings and a Surveyor, mining with first shipment. The patient will receive a drug information handout for each medication shipped and additional FDA Medication Guides as required.       DISEASE/MEDICATION-SPECIFIC INFORMATION        For Hepatitis C patients (refill assessment): Has anything changed regarding the time you take your medications? No    SPECIALTY MEDICATION ADHERENCE     Medication Adherence    Patient reported X missed doses in the last month: 0  Specialty Medication: Epclusa  Patient is on additional specialty medications: No  Informant: patient              Were doses missed due to medication being on hold? No    Epclusa 400-100 mg: ~3 days of medicine on hand       REFERRAL TO PHARMACIST     Referral to the pharmacist: Not needed      [facility]     Shipping address confirmed in Epic.     Patient was notified of new phone menu : No    Delivery Scheduled: Yes, Expected medication delivery date: 04/03/22.     Medication will be delivered via UPS to the prescription address in Epic W[facility].    Arnold Long, PharmD   [facility] Specialty Pharmacist

## 2022-04-02 MED FILL — EPCLUSA 400 MG-100 MG TABLET: ORAL | 28 days supply | Qty: 28 | Fill #1

## 2022-04-05 NOTE — Unmapped (Signed)
Pt left a message to call . Per return call [place] and [place] about [place] on Monday.  He wants to reschedule.   Per call discussed [employer] probably going out of network . He will check back in April to find out status.      04/08/22 10:00 colon RTM SF rescheduled to 06/14/22 10:00 colon  RTM SF  Sf notified.

## 2022-04-05 NOTE — Unmapped (Signed)
Noted  

## 2022-04-17 NOTE — Unmapped (Addendum)
Patient states that he is supposed to be switching to [facility] starting 4/1. Will follow up with him on insurance information on 4/1

## 2022-04-18 ENCOUNTER — Ambulatory Visit: Admit: 2022-04-18 | Payer: MEDICARE | Attending: Gastroenterology | Primary: Gastroenterology

## 2022-04-23 NOTE — Unmapped (Signed)
[facility] Refill Coordination Note    Last shipment of Epclusa - patient should be done with 24 week course of Hep C therapy (2 rounds of 12 weeks) the third week of April    Specialty Medication(s) to be Shipped:   Infectious Disease: Epclusa    Other medication(s) to be shipped: No additional medications requested for fill at this time     Zachary Santiago, DOB: 07/09/1965  Phone: There are no phone numbers on file.      All above HIPAA information was verified with patient.     Was a Nurse, learning disability used for this call? No    Completed refill call assessment today to schedule patient's medication shipment from the [facility] [phone]).  All relevant notes have been reviewed.     Specialty medication(s) and dose(s) confirmed: Regimen is correct and unchanged.   Changes to medications: Zachary Santiago reports no changes at this time.  Changes to insurance: No  New side effects reported not previously addressed with a pharmacist or physician: None reported  Questions for the pharmacist: No    Confirmed patient received a Conservation officer, historic buildings and a Surveyor, mining with first shipment. The patient will receive a drug information handout for each medication shipped and additional FDA Medication Guides as required.       DISEASE/MEDICATION-SPECIFIC INFORMATION        For Hepatitis C patients (refill assessment): Has anything changed regarding the time you take your medications? No    SPECIALTY MEDICATION ADHERENCE     Medication Adherence    Patient reported X missed doses in the last month: 0  Specialty Medication: sofosbuvir-velpatasvir 400-100mg : 1QD  Patient is on additional specialty medications: No  Patient is on more than two specialty medications: No  Any gaps in refill history greater than 2 weeks in the last 3 months: no  Demonstrates understanding of importance of adherence: yes  Informant: patient              Were doses missed due to medication being on hold? No    Epclusa 400-100 mg: ~5 days of medicine on hand (not at home so unable to confirm)    REFERRAL TO PHARMACIST     Referral to the pharmacist: Not needed      [facility]     Shipping address confirmed in Epic.     Patient was notified of new phone menu : No    Delivery Scheduled: Yes, Expected medication delivery date: 3/14.     Medication will be delivered via UPS to the prescription address in Epic WAM.    Teofilo Pod, PharmD   [facility] Specialty Pharmacist

## 2022-04-23 NOTE — Unmapped (Signed)
Specialty Medication(s): [name]    Zachary Santiago has been dis-enrolled from the [facility] specialty pharmacy services due to therapy completion - expected therapy completion date: ~April 11th .    Additional information provided to the patient: Informed patient that if anything changes or if he has further questions to please don't hesitate to reach out. [facility] [phone]    Teofilo Pod, PharmD  [facility] Specialty Pharmacist

## 2022-04-23 NOTE — Unmapped (Signed)
Noted  

## 2022-04-24 MED FILL — EPCLUSA 400 MG-100 MG TABLET: ORAL | 28 days supply | Qty: 28 | Fill #2

## 2022-06-13 MED ORDER — PEG-ELECTROLYTE SOLUTION 420 GRAM ORAL SOLUTION
0 refills | 0 days | Status: CP
Start: 2022-06-13 — End: ?

## 2022-07-09 ENCOUNTER — Ambulatory Visit: Admit: 2022-07-09 | Discharge: 2022-07-09 | Disposition: A | Discharge status: Home / Self Care | Payer: MEDICARE

## 2022-07-09 ENCOUNTER — Emergency Department: Admit: 2022-07-09 | Discharge: 2022-07-09 | Disposition: A | Discharge status: Home / Self Care | Payer: MEDICARE

## 2022-07-09 DIAGNOSIS — I10 Essential (primary) hypertension: Principal | ICD-10-CM
